# Patient Record
Sex: Female | Born: 1960 | Race: White | Hispanic: No | Marital: Married | Smoking: Never smoker
Health system: Southern US, Community
[De-identification: ages and names within clinical notes are randomized; demographics above are authoritative.]

## PROBLEM LIST (undated history)

## (undated) DIAGNOSIS — E7800 Pure hypercholesterolemia, unspecified: Secondary | ICD-10-CM

## (undated) DIAGNOSIS — I10 Essential (primary) hypertension: Secondary | ICD-10-CM

## (undated) DIAGNOSIS — E119 Type 2 diabetes mellitus without complications: Secondary | ICD-10-CM

## (undated) HISTORY — PX: ABDOMINAL HYSTERECTOMY: SHX81

---

## 2005-01-11 ENCOUNTER — Emergency Department: Payer: Self-pay | Admitting: Emergency Medicine

## 2005-04-30 ENCOUNTER — Emergency Department: Payer: Self-pay | Admitting: Emergency Medicine

## 2005-11-18 ENCOUNTER — Emergency Department: Payer: Self-pay | Admitting: Emergency Medicine

## 2006-06-05 ENCOUNTER — Other Ambulatory Visit: Payer: Self-pay

## 2006-06-05 ENCOUNTER — Emergency Department: Payer: Self-pay | Admitting: Unknown Physician Specialty

## 2011-05-20 ENCOUNTER — Ambulatory Visit: Payer: Self-pay | Admitting: Internal Medicine

## 2013-07-06 ENCOUNTER — Emergency Department: Payer: Self-pay | Admitting: Emergency Medicine

## 2013-07-25 ENCOUNTER — Emergency Department: Payer: Self-pay | Admitting: Internal Medicine

## 2013-12-11 ENCOUNTER — Emergency Department: Payer: Self-pay | Admitting: Emergency Medicine

## 2013-12-11 LAB — WET PREP, GENITAL

## 2014-03-04 ENCOUNTER — Emergency Department: Payer: Self-pay | Admitting: Emergency Medicine

## 2014-03-04 LAB — CBC WITH DIFFERENTIAL/PLATELET
Basophil #: 0.1 10*3/uL (ref 0.0–0.1)
Basophil %: 0.8 %
Eosinophil #: 0.4 10*3/uL (ref 0.0–0.7)
Eosinophil %: 5.4 %
HCT: 33 % — ABNORMAL LOW (ref 35.0–47.0)
HGB: 11.1 g/dL — AB (ref 12.0–16.0)
LYMPHS ABS: 2 10*3/uL (ref 1.0–3.6)
LYMPHS PCT: 28.8 %
MCH: 29.7 pg (ref 26.0–34.0)
MCHC: 33.8 g/dL (ref 32.0–36.0)
MCV: 88 fL (ref 80–100)
MONO ABS: 0.5 x10 3/mm (ref 0.2–0.9)
Monocyte %: 6.5 %
NEUTROS PCT: 58.5 %
Neutrophil #: 4.1 10*3/uL (ref 1.4–6.5)
Platelet: 202 10*3/uL (ref 150–440)
RBC: 3.75 10*6/uL — ABNORMAL LOW (ref 3.80–5.20)
RDW: 13.5 % (ref 11.5–14.5)
WBC: 7 10*3/uL (ref 3.6–11.0)

## 2014-03-04 LAB — COMPREHENSIVE METABOLIC PANEL
ALK PHOS: 65 U/L
Albumin: 3.5 g/dL (ref 3.4–5.0)
Anion Gap: 6 — ABNORMAL LOW (ref 7–16)
BUN: 17 mg/dL (ref 7–18)
Bilirubin,Total: 0.4 mg/dL (ref 0.2–1.0)
CALCIUM: 8.4 mg/dL — AB (ref 8.5–10.1)
CHLORIDE: 106 mmol/L (ref 98–107)
Co2: 27 mmol/L (ref 21–32)
Creatinine: 1.15 mg/dL (ref 0.60–1.30)
EGFR (African American): >60
EGFR (Non-African Amer.): 55 — ABNORMAL LOW
Glucose: 170 mg/dL — ABNORMAL HIGH (ref 65–99)
Osmolality: 283 (ref 275–301)
Potassium: 3.2 mmol/L — ABNORMAL LOW (ref 3.5–5.1)
SGOT(AST): 22 U/L (ref 15–37)
SGPT (ALT): 26 U/L (ref 12–78)
SODIUM: 139 mmol/L (ref 136–145)
Total Protein: 7.2 g/dL (ref 6.4–8.2)

## 2014-03-04 LAB — URINALYSIS, COMPLETE
BILIRUBIN, UR: NEGATIVE
Blood: NEGATIVE
KETONE: NEGATIVE
LEUKOCYTE ESTERASE: NEGATIVE
NITRITE: NEGATIVE
PH: 7 (ref 4.5–8.0)
Protein: NEGATIVE
SPECIFIC GRAVITY: 1.007 (ref 1.003–1.030)
WBC UR: <1 /HPF (ref 0–5)

## 2014-07-03 ENCOUNTER — Emergency Department: Payer: Self-pay | Admitting: Emergency Medicine

## 2014-07-03 LAB — URINALYSIS, COMPLETE
BACTERIA: NONE SEEN
Bilirubin,UR: NEGATIVE
Blood: NEGATIVE
Glucose,UR: >=500 mg/dL (ref 0–75)
Ketone: NEGATIVE
Nitrite: NEGATIVE
PROTEIN: NEGATIVE
Ph: 5 (ref 4.5–8.0)
RBC,UR: 4 /HPF (ref 0–5)
Specific Gravity: 1.027 (ref 1.003–1.030)
Squamous Epithelial: 6

## 2014-09-09 ENCOUNTER — Emergency Department: Payer: Self-pay | Admitting: Internal Medicine

## 2014-09-09 LAB — BASIC METABOLIC PANEL
ANION GAP: 10 (ref 7–16)
BUN: 13 mg/dL (ref 7–18)
Calcium, Total: 8.8 mg/dL (ref 8.5–10.1)
Chloride: 106 mmol/L (ref 98–107)
Co2: 25 mmol/L (ref 21–32)
Creatinine: 0.8 mg/dL (ref 0.60–1.30)
GLUCOSE: 211 mg/dL — AB (ref 65–99)
OSMOLALITY: 288 (ref 275–301)
Potassium: 4.1 mmol/L (ref 3.5–5.1)
Sodium: 141 mmol/L (ref 136–145)

## 2014-09-09 LAB — CBC WITH DIFFERENTIAL/PLATELET
Basophil #: 0.1 10*3/uL (ref 0.0–0.1)
Basophil %: 0.9 %
EOS PCT: 7.1 %
Eosinophil #: 0.5 10*3/uL (ref 0.0–0.7)
HCT: 35.4 % (ref 35.0–47.0)
HGB: 12 g/dL (ref 12.0–16.0)
LYMPHS ABS: 2 10*3/uL (ref 1.0–3.6)
Lymphocyte %: 29.8 %
MCH: 29 pg (ref 26.0–34.0)
MCHC: 33.9 g/dL (ref 32.0–36.0)
MCV: 86 fL (ref 80–100)
MONO ABS: 0.4 x10 3/mm (ref 0.2–0.9)
Monocyte %: 6.7 %
NEUTROS ABS: 3.7 10*3/uL (ref 1.4–6.5)
Neutrophil %: 55.5 %
PLATELETS: 189 10*3/uL (ref 150–440)
RBC: 4.14 10*6/uL (ref 3.80–5.20)
RDW: 14.7 % — ABNORMAL HIGH (ref 11.5–14.5)
WBC: 6.6 10*3/uL (ref 3.6–11.0)

## 2015-07-06 ENCOUNTER — Encounter: Payer: Self-pay | Admitting: Emergency Medicine

## 2015-07-06 ENCOUNTER — Emergency Department: Payer: BLUE CROSS/BLUE SHIELD

## 2015-07-06 ENCOUNTER — Emergency Department
Admission: EM | Admit: 2015-07-06 | Discharge: 2015-07-06 | Disposition: A | Discharge status: Home / Self Care | Payer: BLUE CROSS/BLUE SHIELD | Attending: Emergency Medicine | Admitting: Emergency Medicine

## 2015-07-06 DIAGNOSIS — Z79899 Other long term (current) drug therapy: Secondary | ICD-10-CM | POA: Diagnosis not present

## 2015-07-06 DIAGNOSIS — R52 Pain, unspecified: Secondary | ICD-10-CM

## 2015-07-06 DIAGNOSIS — M545 Low back pain: Secondary | ICD-10-CM | POA: Diagnosis not present

## 2015-07-06 DIAGNOSIS — R81 Glycosuria: Secondary | ICD-10-CM | POA: Insufficient documentation

## 2015-07-06 DIAGNOSIS — E119 Type 2 diabetes mellitus without complications: Secondary | ICD-10-CM | POA: Insufficient documentation

## 2015-07-06 DIAGNOSIS — I10 Essential (primary) hypertension: Secondary | ICD-10-CM | POA: Insufficient documentation

## 2015-07-06 DIAGNOSIS — R1031 Right lower quadrant pain: Secondary | ICD-10-CM | POA: Diagnosis not present

## 2015-07-06 DIAGNOSIS — M25552 Pain in left hip: Secondary | ICD-10-CM | POA: Insufficient documentation

## 2015-07-06 HISTORY — DX: Type 2 diabetes mellitus without complications: E11.9

## 2015-07-06 HISTORY — DX: Essential (primary) hypertension: I10

## 2015-07-06 HISTORY — DX: Pure hypercholesterolemia, unspecified: E78.00

## 2015-07-06 LAB — URINALYSIS COMPLETE WITH MICROSCOPIC (ARMC ONLY)
BILIRUBIN URINE: NEGATIVE
Bacteria, UA: NONE SEEN
Hgb urine dipstick: NEGATIVE
Ketones, ur: NEGATIVE mg/dL
Nitrite: NEGATIVE
Protein, ur: NEGATIVE mg/dL
Specific Gravity, Urine: 1.02 (ref 1.005–1.030)
pH: 6 (ref 5.0–8.0)

## 2015-07-06 MED ORDER — OXYCODONE-ACETAMINOPHEN 5-325 MG PO TABS: 1.0000 | ORAL_TABLET | Freq: Four times a day (QID) | ORAL | Status: AC | PRN

## 2015-07-06 MED ORDER — DICLOFENAC SODIUM 1 % TD GEL: 4.0000 g | Freq: Four times a day (QID) | TRANSDERMAL | Status: AC

## 2015-07-06 MED ORDER — PREDNISONE 10 MG (21) PO TBPK: ORAL_TABLET | ORAL | Status: AC

## 2015-07-06 MED ORDER — KETOROLAC TROMETHAMINE 60 MG/2ML IM SOLN
60.0000 mg | Freq: Once | INTRAMUSCULAR | Status: AC
  Administered 2015-07-06: 60 mg via INTRAMUSCULAR
  Filled 2015-07-06: qty 2

## 2015-07-06 NOTE — ED Notes (Signed)
Having left hip pain for about 1 week   Denies any injury  Describes pain as burning

## 2015-07-06 NOTE — Discharge Instructions (Signed)
Please follow up with your primary care provider early next week. Return to the ER for symptoms that change or worsen if you are unable to schedule an appointment.

## 2015-07-06 NOTE — ED Provider Notes (Signed)
[facility] Emergency Department Provider Note ____________________________________________  Time seen: Approximately 12:22 PM  I have reviewed the triage vital signs and the nursing notes.   HISTORY  Chief Complaint Hip Pain   HPI Kim Peterson is a 54 y.o. female who presents to the emergency department for evaluation of left hip pain. She denies injury. She has never had this pain before. She describes the pain as a stinging, pain that starts in the lower back and radiates around the hip and into the right lower quadrant. She denies rash or history of shingles. She has a history of arthritis in her lower back.    Past Medical History  Diagnosis Date  . Diabetes mellitus without complication (HCC)   . Hypertension   . Hypercholesteremia     There are no active problems to display for this patient.   Past Surgical History  Procedure Laterality Date  . Abdominal hysterectomy      Current Outpatient Rx  Name  Route  Sig  Dispense  Refill  . amLODipine (NORVASC) 10 MG tablet   Oral   Take 10 mg by mouth daily.         . enalapril (VASOTEC) 5 MG tablet   Oral   Take 5 mg by mouth daily.         Marland Kitchen lovastatin (MEVACOR) 40 MG tablet   Oral   Take 80 mg by mouth at bedtime.         . metFORMIN (GLUCOPHAGE) 500 MG tablet   Oral   Take 500 mg by mouth 2 (two) times daily with a meal.         . metoprolol tartrate (LOPRESSOR) 25 MG tablet   Oral   Take 25 mg by mouth 2 (two) times daily.         . ranitidine (ZANTAC) 150 MG tablet   Oral   Take 150 mg by mouth 2 (two) times daily.         . diclofenac sodium (VOLTAREN) 1 % GEL   Topical   Apply 4 g topically 4 (four) times daily.   1 Tube   1   . oxyCODONE-acetaminophen (ROXICET) 5-325 MG tablet   Oral   Take 1 tablet by mouth every 6 (six) hours as needed.   9 tablet   0   . predniSONE (STERAPRED UNI-PAK 21 TAB) 10 MG (21) TBPK tablet      Take 6 tablets on day  1 Take 5 tablets on day 2 Take 4 tablets on day 3 Take 3 tablets on day 4 Take 2 tablets on day 5 Take 1 tablet on day 6   21 tablet   0     Allergies Hctz  No family history on file.  Social History Social History  Substance Use Topics  . Smoking status: Never Smoker   . Smokeless tobacco: None  . Alcohol Use: No    Review of Systems Constitutional: No recent illness. Eyes: No visual changes. ENT: No sore throat. Cardiovascular: Denies chest pain or palpitations. Respiratory: Denies shortness of breath. Gastrointestinal: No abdominal pain.  Genitourinary: Negative for dysuria. Musculoskeletal: Pain in left lower back that radiates into the left hip Skin: Negative for rash. Neurological: Negative for headaches, focal weakness or numbness. 10-point ROS otherwise negative.  ____________________________________________   PHYSICAL EXAM:  VITAL SIGNS: ED Triage Vitals  Enc Vitals Group     BP 07/06/15 1206 158/71 mmHg     Pulse Rate 07/06/15  1206 89     Resp 07/06/15 1206 18     Temp 07/06/15 1206 98.4 F (36.9 C)     Temp Source 07/06/15 1206 Oral     SpO2 07/06/15 1206 98 %     Weight 07/06/15 1206 160 lb (72.576 kg)     Height 07/06/15 1206 5\' 3"  (1.6 m)     Head Cir --      Peak Flow --      Pain Score 07/06/15 1155 10     Pain Loc --      Pain Edu? --      Excl. in GC? --     Constitutional: Alert and oriented. Well appearing and in no acute distress. Eyes: Conjunctivae are normal. EOMI. Head: Atraumatic. Nose: No congestion/rhinnorhea. Neck: No stridor.  Respiratory: Normal respiratory effort.   Musculoskeletal: Tenderness to light touch of the skin over the lumbar area and around to the left hip. Neurologic:  Normal speech and language. No gross focal neurologic deficits are appreciated. Speech is normal. No gait instability. Skin:  Skin is warm, dry and intact. Atraumatic. No rash noted. Psychiatric: Mood and affect are normal. Speech and  behavior are normal.  ____________________________________________   LABS (all labs ordered are listed, but only abnormal results are displayed)  Labs Reviewed  URINALYSIS COMPLETEWITH MICROSCOPIC ([facility] ONLY) - Abnormal; Notable for the following:    Color, Urine YELLOW (*)    APPearance CLEAR (*)    Glucose, UA >500 (*)    Leukocytes, UA 1+ (*)    Squamous Epithelial / LPF 0-5 (*)    All other components within normal limits   ____________________________________________  RADIOLOGY  No indication for acute bony abnormality. ____________________________________________   PROCEDURES  Procedure(s) performed: None   ____________________________________________   INITIAL IMPRESSION / ASSESSMENT AND PLAN / ED COURSE  Pertinent labs & imaging results that were available during my care of the patient were reviewed by me and considered in my medical decision making (see chart for details).  Symptoms somewhat concerning for herpes zoster. Patient advised of the possibility. She was advised to call and schedule [facility]. She was also counseled to take her diabetes medications as prescribed and adhere to her diabetic diet. She was advised to return to the emergency department for symptoms that change or worsen if she is unable schedule an appointment. ____________________________________________   FINAL CLINICAL IMPRESSION(S) / ED DIAGNOSES  Final diagnoses:  Pain  Hip pain, left  Glucosuria       Chinita PesterCari B Braya Habermehl, FNP 07/06/15 1425  Darien Ramusavid W Kaminski, MD 07/06/15 [phone]

## 2015-07-06 NOTE — ED Notes (Signed)
States having pain from left hip /leg w/o injury ambulates well to treatment room

## 2016-01-25 ENCOUNTER — Encounter: Payer: Self-pay | Admitting: Obstetrics and Gynecology

## 2016-02-07 ENCOUNTER — Encounter: Payer: Self-pay | Admitting: Obstetrics and Gynecology

## 2017-07-31 IMAGING — CR DG HIP (WITH OR WITHOUT PELVIS) 2-3V*L*
1 series · 3 of 3 positions shown · non-contrast
Comparison: None.

CLINICAL DATA: Left hip pain and burning for 10 days without
injury. New onset left hip pain and burning.

EXAM:
DG HIP (WITH OR WITHOUT PELVIS) 2-3V LEFT

[Series 1: t hip left 0-3yrs (8-12cm) · 0.14mm/px · 3 of 3 slices shown]
[im 1/3]
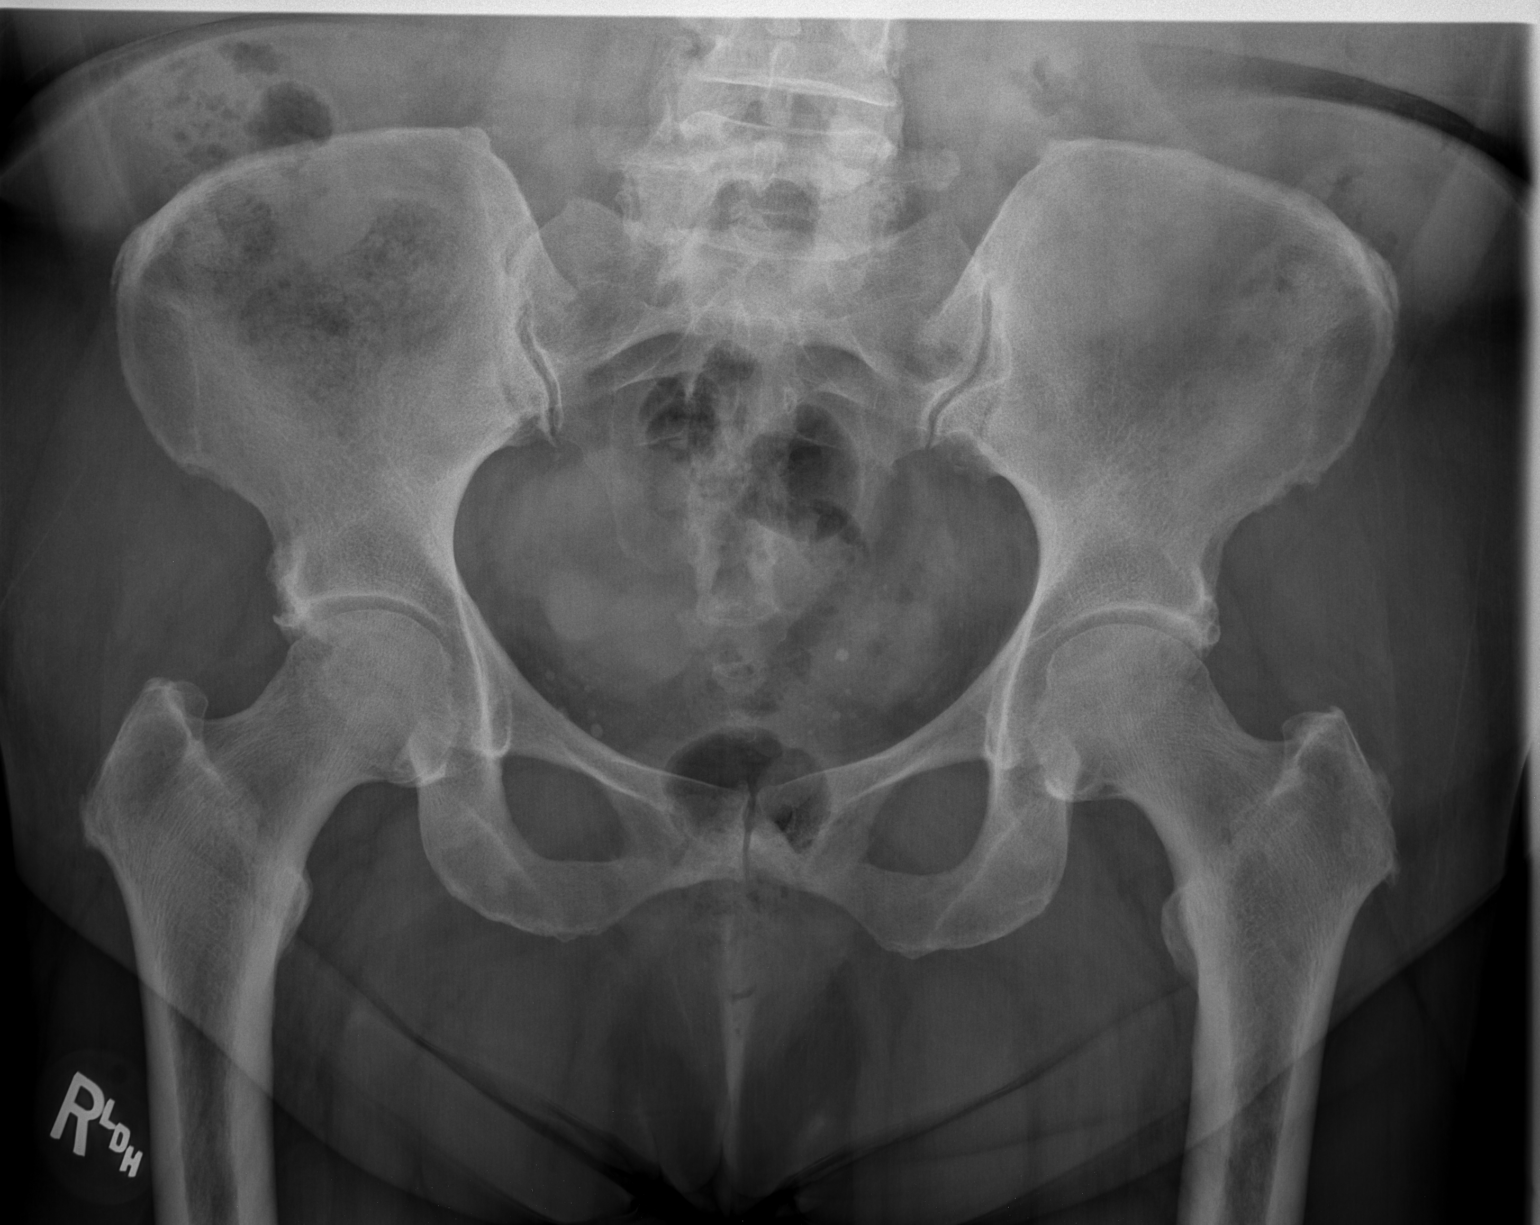
[im 2/3]
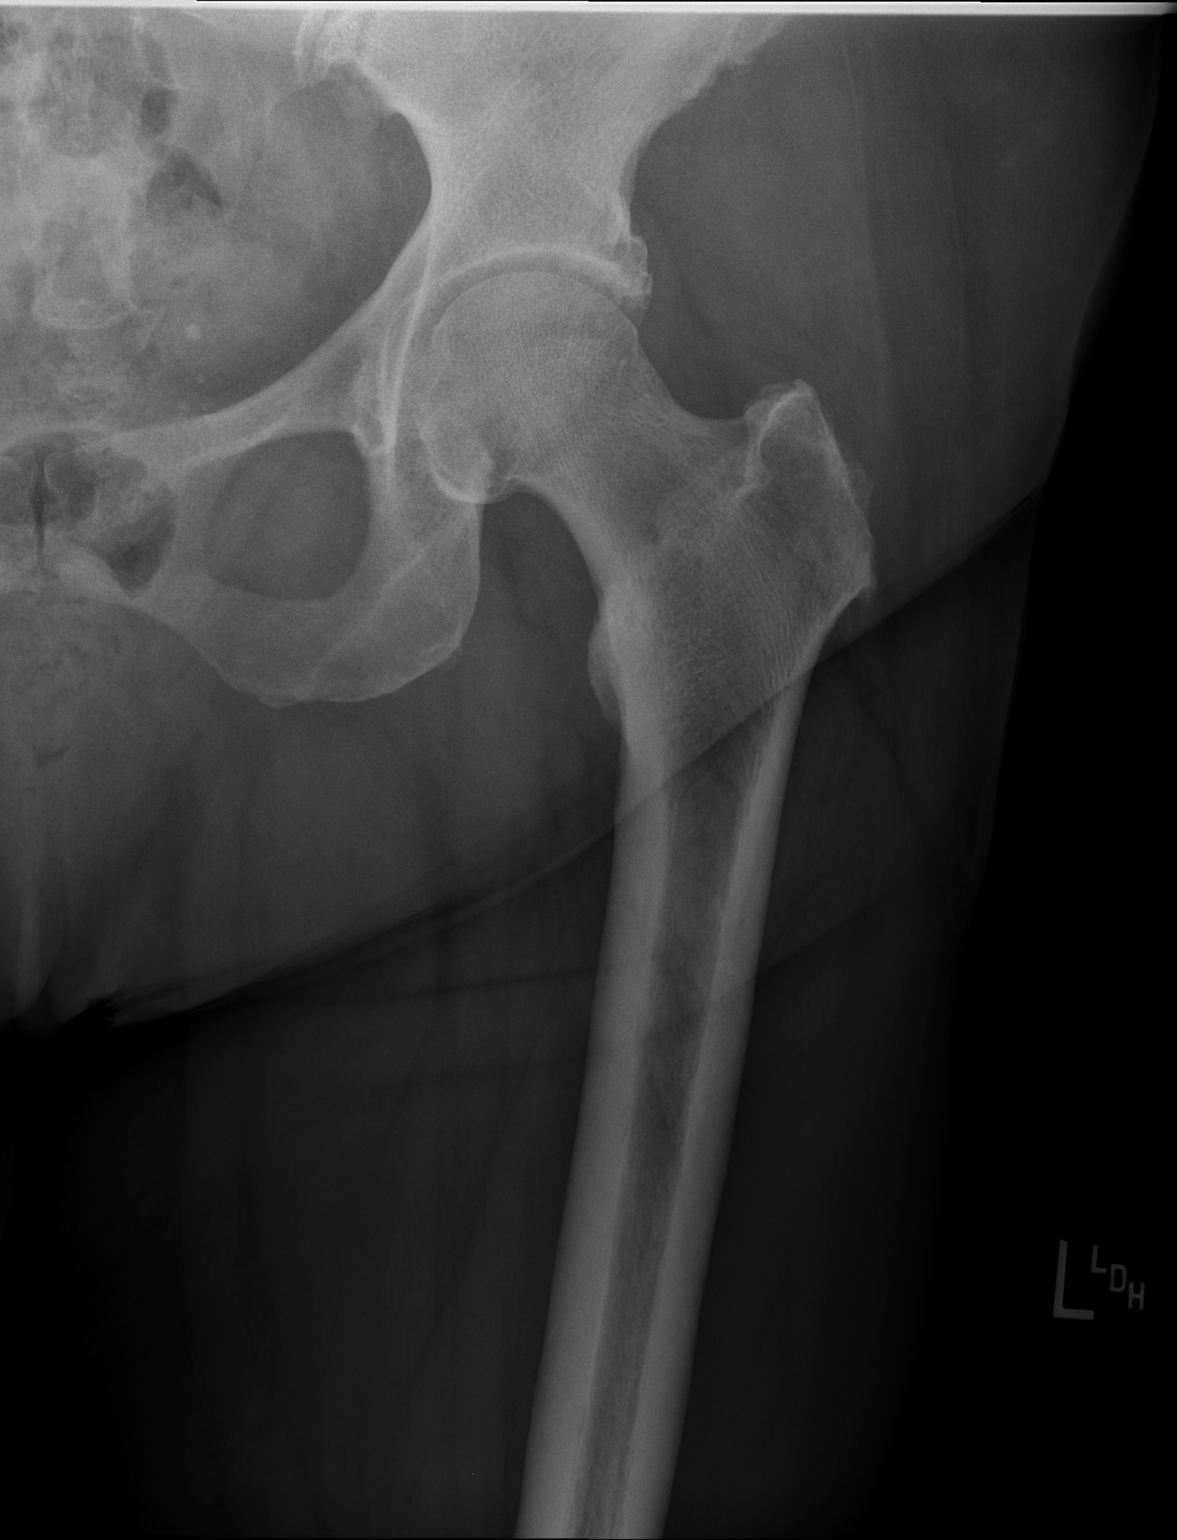
[im 3/3]
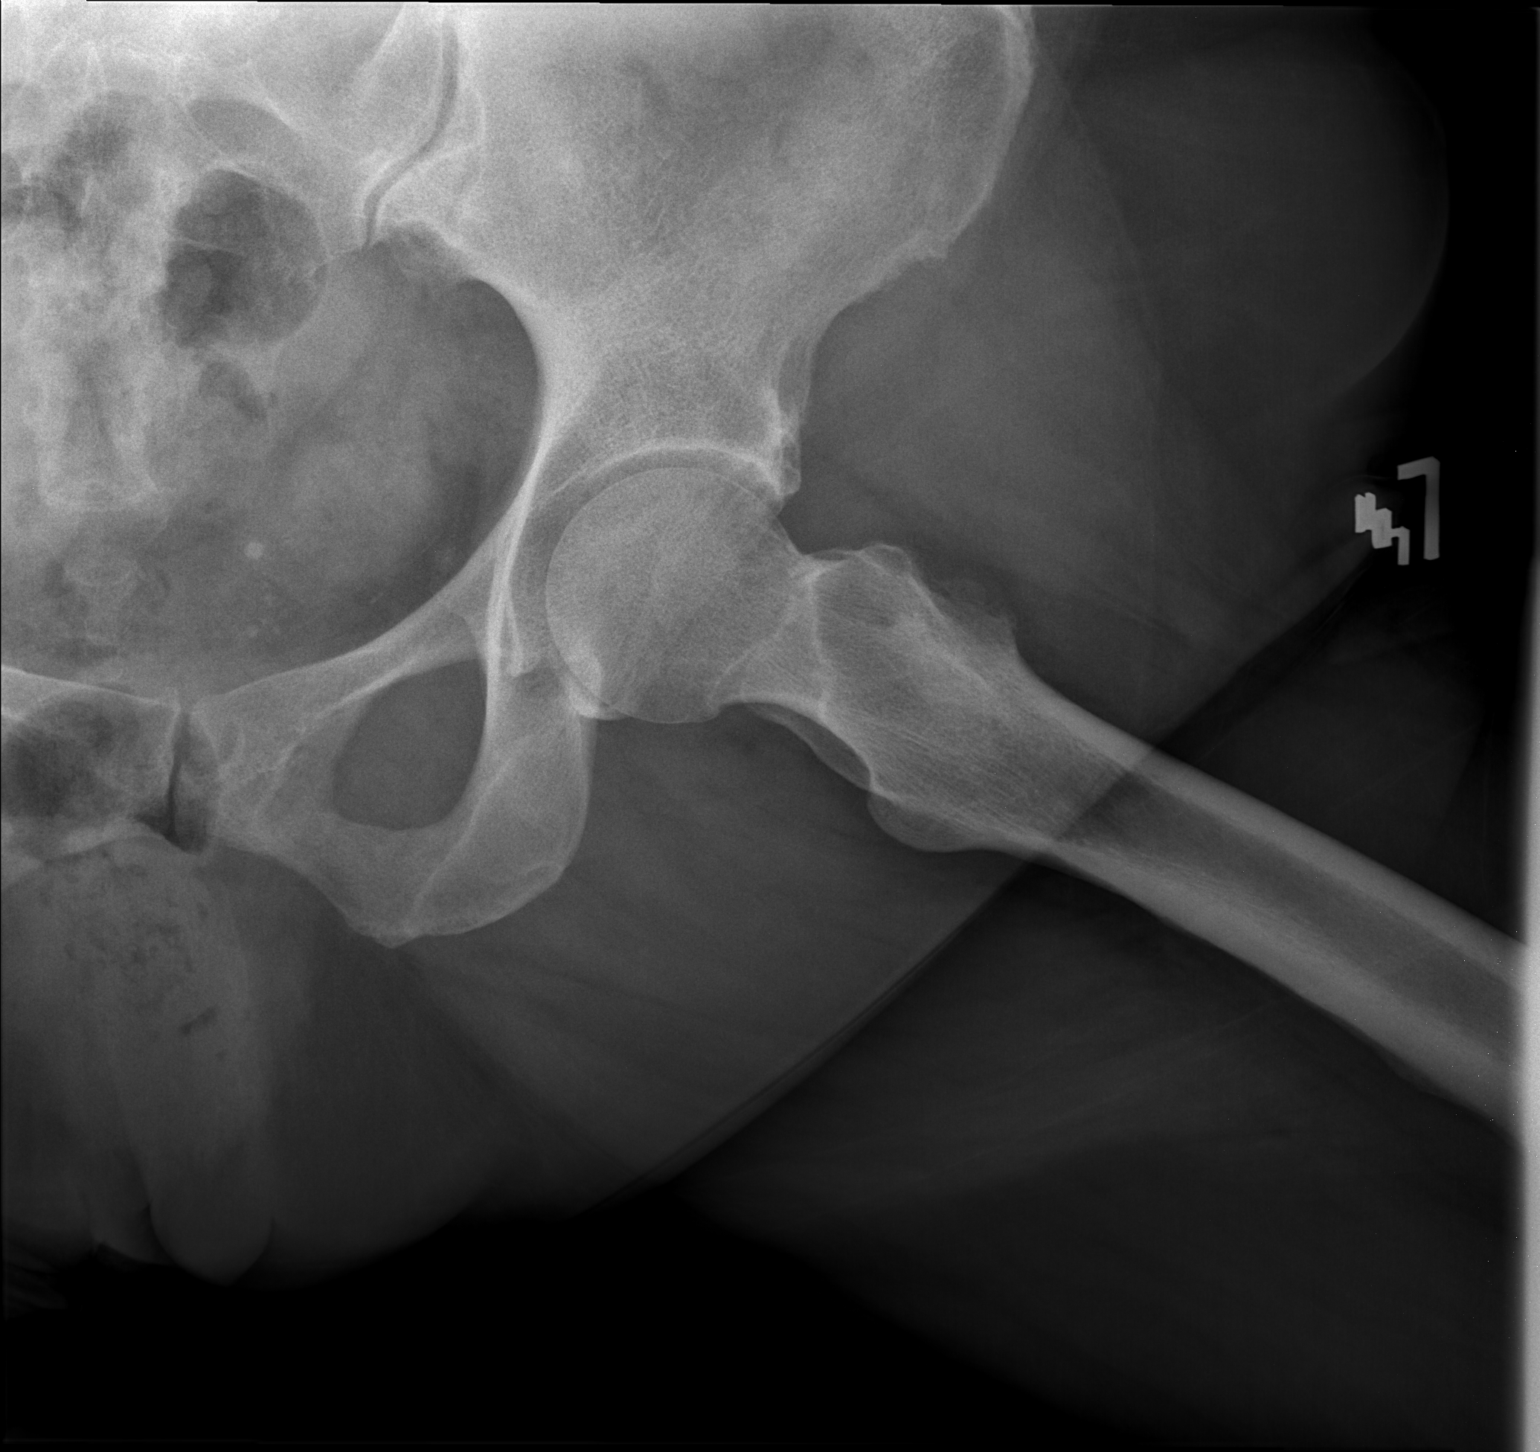

[3 of 3 positions shown; findings below may reference images not displayed]

FINDINGS: Mild degenerative changes are noted in the lower lumbar spine and
bilateral hips.

No acute osseous abnormality is present in the left hip. There is no
definite effusion. Joint space is preserved.
IMPRESSION: 1. No acute abnormality.
2. Mild degenerative changes in the lower lumbar spine and bilateral
hips.

## 2019-12-17 ENCOUNTER — Ambulatory Visit: Payer: BLUE CROSS/BLUE SHIELD | Attending: Internal Medicine

## 2019-12-17 DIAGNOSIS — Z23 Encounter for immunization: Secondary | ICD-10-CM

## 2019-12-17 NOTE — Progress Notes (Signed)
   Covid-19 Vaccination Clinic  Name:  Kim Peterson    MRN: [id number] DOB: [birth year]  12/17/2019  Kim Peterson was observed post Covid-19 immunization for 15 minutes without incident. She was provided with Vaccine Information Sheet and instruction to access the V-Safe system.   Kim Peterson was instructed to call 911 with any severe reactions post vaccine: Marland Kitchen Difficulty breathing  . Swelling of face and throat  . A fast heartbeat  . A bad rash all over body  . Dizziness and weakness   Immunizations Administered    Name Date Dose VIS Date Route   Pfizer COVID-19 Vaccine 12/17/2019 11:10 AM 0.3 mL 10/13/2018 Intramuscular   Manufacturer: [employer], [employer]   Lot: [id number]   NDC: [address]-0

## 2020-01-11 ENCOUNTER — Ambulatory Visit: Payer: BLUE CROSS/BLUE SHIELD | Attending: Internal Medicine

## 2020-01-11 DIAGNOSIS — Z23 Encounter for immunization: Secondary | ICD-10-CM

## 2020-01-11 NOTE — Progress Notes (Signed)
   Covid-19 Vaccination Clinic  Name:  Kim Peterson    MRN: [id number] DOB: 06/09/1961  01/11/2020  [name] was observed post Covid-19 immunization for 15 minutes without incident. She was provided with Vaccine Information Sheet and instruction to access the V-Safe system.   [name] was instructed to call 911 with any severe reactions post vaccine: Marland Kitchen Difficulty breathing  . Swelling of face and throat  . A fast heartbeat  . A bad rash all over body  . Dizziness and weakness   Immunizations Administered    Name Date Dose VIS Date Route   Pfizer COVID-19 Vaccine 01/11/2020 11:19 AM 0.3 mL 10/13/2018 Intramuscular   Manufacturer: [employer], [employer]   Lot: [id number]   NDC: [address]-7
